# Patient Record
Sex: Male | Born: 1995 | Hispanic: No | Marital: Single | Smoking: Current every day smoker
Health system: Southern US, Community
[De-identification: ages and names within clinical notes are randomized; demographics above are authoritative.]

---
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
## 2013-08-04 ENCOUNTER — Emergency Department (HOSPITAL_COMMUNITY)
Admission: EM | Admit: 2013-08-04 | Discharge: 2013-08-05 | Disposition: A | Discharge status: Home / Self Care | Payer: No Typology Code available for payment source | Attending: Emergency Medicine | Admitting: Emergency Medicine

## 2013-08-04 DIAGNOSIS — Y9389 Activity, other specified: Secondary | ICD-10-CM | POA: Insufficient documentation

## 2013-08-04 DIAGNOSIS — F29 Unspecified psychosis not due to a substance or known physiological condition: Secondary | ICD-10-CM | POA: Insufficient documentation

## 2013-08-04 DIAGNOSIS — Z23 Encounter for immunization: Secondary | ICD-10-CM | POA: Insufficient documentation

## 2013-08-04 DIAGNOSIS — F1010 Alcohol abuse, uncomplicated: Secondary | ICD-10-CM | POA: Insufficient documentation

## 2013-08-04 DIAGNOSIS — F10929 Alcohol use, unspecified with intoxication, unspecified: Secondary | ICD-10-CM

## 2013-08-04 DIAGNOSIS — S0100XA Unspecified open wound of scalp, initial encounter: Secondary | ICD-10-CM | POA: Insufficient documentation

## 2013-08-04 DIAGNOSIS — F17200 Nicotine dependence, unspecified, uncomplicated: Secondary | ICD-10-CM | POA: Insufficient documentation

## 2013-08-04 DIAGNOSIS — S0101XA Laceration without foreign body of scalp, initial encounter: Secondary | ICD-10-CM

## 2013-08-04 DIAGNOSIS — Y92410 Unspecified street and highway as the place of occurrence of the external cause: Secondary | ICD-10-CM | POA: Insufficient documentation

## 2013-08-04 MED ORDER — TETANUS-DIPHTHERIA TOXOIDS TD 5-2 LFU IM INJ
0.5000 mL | INJECTION | Freq: Once | INTRAMUSCULAR | Status: AC
  Administered 2013-08-05: 0.5 mL via INTRAMUSCULAR
  Filled 2013-08-04: qty 0.5
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
## 2013-08-05 ENCOUNTER — Encounter (HOSPITAL_COMMUNITY): Payer: Self-pay | Admitting: Emergency Medicine

## 2013-08-05 ENCOUNTER — Emergency Department (HOSPITAL_COMMUNITY): Payer: No Typology Code available for payment source

## 2013-08-05 LAB — COMPREHENSIVE METABOLIC PANEL
ALT: 34 U/L (ref 0–53)
Alkaline Phosphatase: 78 U/L (ref 39–117)
CO2: 24 mEq/L (ref 19–32)
Calcium: 8.6 mg/dL (ref 8.4–10.5)
GFR calc Af Amer: >90 mL/min (ref >90)
GFR calc non Af Amer: >90 mL/min (ref >90)
Glucose, Bld: 121 mg/dL — ABNORMAL HIGH (ref 70–99)
Sodium: 141 mEq/L (ref 135–145)
Total Bilirubin: 0.4 mg/dL (ref 0.3–1.2)

## 2013-08-05 LAB — POCT I-STAT, CHEM 8
Calcium, Ion: 1.16 mmol/L (ref 1.12–1.23)
Hemoglobin: 16.3 g/dL (ref 13.0–17.0)
Potassium: 3.6 mEq/L (ref 3.5–5.1)
Sodium: 145 mEq/L (ref 135–145)
TCO2: 25 mmol/L (ref 0–100)

## 2013-08-05 LAB — CBC
Hemoglobin: 16.4 g/dL (ref 13.0–17.0)
MCHC: 36.2 g/dL — ABNORMAL HIGH (ref 30.0–36.0)
Platelets: 331 10*3/uL (ref 150–400)
RDW: 12.4 % (ref 11.5–15.5)

## 2013-08-05 LAB — RAPID URINE DRUG SCREEN, HOSP PERFORMED
Benzodiazepines: NOT DETECTED
Opiates: NOT DETECTED
Tetrahydrocannabinol: POSITIVE — AB

## 2013-08-05 LAB — URINE MICROSCOPIC-ADD ON

## 2013-08-05 LAB — URINALYSIS, ROUTINE W REFLEX MICROSCOPIC
Bilirubin Urine: NEGATIVE
Glucose, UA: NEGATIVE mg/dL
Ketones, ur: NEGATIVE mg/dL
pH: 6 (ref 5.0–8.0)

## 2013-08-05 LAB — PROTIME-INR
INR: 0.88 (ref 0.00–1.49)
Prothrombin Time: 11.8 seconds (ref 11.6–15.2)

## 2013-08-05 LAB — CG4 I-STAT (LACTIC ACID): Lactic Acid, Venous: 2.63 mmol/L — ABNORMAL HIGH (ref 0.5–2.2)

## 2013-08-05 MED ORDER — OXYCODONE-ACETAMINOPHEN 5-325 MG PO TABS
1.0000 | ORAL_TABLET | Freq: Once | ORAL | Status: AC
  Administered 2013-08-05: 1 via ORAL
  Filled 2013-08-05: qty 1

## 2013-08-05 MED ORDER — OXYCODONE-ACETAMINOPHEN 5-325 MG PO TABS: 2.0000 | ORAL_TABLET | ORAL | Status: DC | PRN

## 2013-08-05 MED ORDER — IOHEXOL 300 MG/ML  SOLN
100.0000 mL | Freq: Once | INTRAMUSCULAR | Status: AC | PRN
  Administered 2013-08-05: 100 mL via INTRAVENOUS

## 2013-08-05 MED ORDER — SODIUM CHLORIDE 0.9 % IV SOLN
Freq: Once | INTRAVENOUS | Status: AC
  Administered 2013-08-04: via INTRAVENOUS

## 2013-08-05 NOTE — ED Notes (Signed)
Patient removed blood pressure cuff.  No vital available for 1 hour.
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
## 2013-08-05 NOTE — ED Notes (Signed)
Back from CT
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
## 2013-08-05 NOTE — ED Notes (Signed)
Patient and family members are having discussion.  Explained to Father that we did not need to have arguing going on.

## 2013-08-05 NOTE — ED Notes (Signed)
Collar removed per Dr Norlene Campbell
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
## 2013-08-11 ENCOUNTER — Encounter (HOSPITAL_COMMUNITY): Payer: Self-pay | Admitting: Emergency Medicine

## 2013-08-11 ENCOUNTER — Emergency Department (HOSPITAL_COMMUNITY)
Admission: EM | Admit: 2013-08-11 | Discharge: 2013-08-11 | Disposition: A | Discharge status: Home / Self Care | Payer: No Typology Code available for payment source | Attending: Emergency Medicine | Admitting: Emergency Medicine

## 2013-08-11 DIAGNOSIS — Z4802 Encounter for removal of sutures: Secondary | ICD-10-CM

## 2013-08-11 DIAGNOSIS — F17200 Nicotine dependence, unspecified, uncomplicated: Secondary | ICD-10-CM | POA: Insufficient documentation

## 2013-08-11 DIAGNOSIS — R51 Headache: Secondary | ICD-10-CM | POA: Insufficient documentation

## 2013-08-11 NOTE — ED Notes (Signed)
Pt here for staple removal from scalp.
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
## 2013-08-11 NOTE — ED Provider Notes (Signed)
Medical screening examination/treatment/procedure(s) were performed by non-physician practitioner and as supervising physician I was immediately available for consultation/collaboration.  EKG Interpretation   None         Charles B. Bernette MayersSheldon, MD 08/11/13 [phone]

## 2013-08-11 NOTE — Discharge Instructions (Signed)
Please read and follow all provided instructions.  Your diagnoses today include:  1. Encounter for staple removal     Tests performed today include:  Vital signs. See below for your results today.   Medications prescribed:   None  Home care instructions:  Follow any educational materials contained in this packet.  Follow-up instructions: Please follow-up with your primary care provider as needed for further evaluation of your symptoms.  If you do not have a primary care doctor -- see below for referral information.   Return instructions:   Please return to the Emergency Department if you experience worsening symptoms.   Please return if you have any other emergent concerns.  Additional Information:  Your vital signs today were: BP 121/75   Pulse 97   Temp(Src) 97.5 F (36.4 C) (Oral)   Resp 20   SpO2 98% If your blood pressure (BP) was elevated above 135/85 this visit, please have this repeated by your doctor within one month. ---------------  Emergency Department Resource Guide 1) Find a Doctor and Pay Out of Pocket Although you won't have to find out who is covered by your insurance plan, it is a good idea to ask around and get recommendations. You will then need to call the office and see if the doctor you have chosen will accept you as a new patient and what types of options they offer for patients who are self-pay. Some doctors offer discounts or will set up payment plans for their patients who do not have insurance, but you will need to ask so you aren't surprised when you get to your appointment.  2) Contact Your Local Health Department Not all health departments have doctors that can see patients for sick visits, but many do, so it is worth a call to see if yours does. If you don't know where your local health department is, you can check in your phone book. The CDC also has a tool to help you locate your state's health department, and many state websites also have  listings of all of their local health departments.  3) Find a Walk-in Clinic If your illness is not likely to be very severe or complicated, you may want to try a walk in clinic. These are popping up all over the country in pharmacies, drugstores, and shopping centers. They're usually staffed by nurse practitioners or physician assistants that have been trained to treat common illnesses and complaints. They're usually fairly quick and inexpensive. However, if you have serious medical issues or chronic medical problems, these are probably not your best option.  No Primary Care Doctor: - Call Health Connect at  [phone] - they can help you locate a primary care doctor that  accepts your insurance, provides certain services, etc. - Physician Referral Service- [phone]  Chronic Pain Problems: Organization         Address  Phone   Notes  [facility]  [phone] Patients need to be referred by their primary care doctor.   Medication Assistance: Organization         Address  Phone   Notes  [facility] Medication [facility] [address] [place], [place] [address] [phone] --Must be a resident of [facility] -- Must have NO insurance coverage whatsoever (no Medicaid/ Medicare, etc.) -- The pt. MUST have a primary care doctor that directs their care regularly and follows them in the community   [facility]  [phone]   [address]  (  [phone]    Agencies that provide inexpensive medical care: Organization         Address  Phone   Notes  [facility]  [phone]   [facility]    [phone]   [facility] [address] [place], [place] [address] [phone]   [facility] [address], [place] [phone]   Planned Parenthood    [phone]   [facility]    [phone]   [facility]  [address], [place] Phone:  [phone], Fax:  [phone] Hours of Operation:  9 am - 6 pm, M-F.  Also accepts Medicaid/Medicare and self-pay.  [facility]  [address], [place] Phone: [phone], Fax: [phone]. Hours of Operation:  8:30 am - 5:30 pm, M-F.  Also accepts Medicaid and self-pay.  [facility] [place] [address] [place] Phone: [phone]   [facility] [address] Natasha Bence [place], [place] [phone], Ext. [phone] Mondays & Thursdays: 7-9 AM.  First 15 patients are seen on a first come, first serve basis.    Medicaid-accepting [facility] Providers:  Organization         Address  Phone   Notes  [facility] [address], [place] [phone] Also accepts self-pay patients.  [facility] [address] Laurell Josephs [place], [place]  [phone]   [facility] [address], [place] [phone]   [facility] [address] [place] [phone]   Renaye Rakers [address], [place]   [phone] Only accepts [place] Access [place] patients after they have their name applied to their card.   Self-Pay (no insurance) in [facility]:  Organization         Address  Phone   Notes  Sickle Cell Patients, [facility] [address] [place], [place] [phone]   [facility] [address] [place], [place] [phone]   [facility] [place]  [address], [place] [phone]   [facility]/Dr. Osei-Bonsu  [address] [place] or [address], [place] [phone] Phone number for both [place] and [place] locations is the same.  [facility] [address], [place] [phone]   [facility] [address],  [place] or [address] [phone]   [facility] [address], [place] [phone], phone; [phone], fax Sees patients 1st and 3rd Saturday of every month.  Must not qualify for public or private insurance (i.e. Medicaid, Medicare, [place] Health Choice, Veterans' Benefits)  Household income should be no more than 200% of the poverty level The clinic cannot treat you if you are pregnant or think you are pregnant  Sexually transmitted diseases are not treated at the clinic.    Dental Care: Organization         Address  Phone  Notes  [facility] [address] [place], [place] [phone] Accepts children up to age 79 who are enrolled in [place] or [place] Health Choice; pregnant women with a Medicaid card; and children who  have applied for Medicaid or [place] Health Choice, but were declined, whose parents can pay a reduced fee at time of service.  [facility] [place]  [address], [place] [phone] Accepts children up to age 521 who are enrolled in [place] or [place] Health Choice; pregnant women with a Medicaid card; and children who have applied for Medicaid or [place] Health Choice, but were declined, whose parents can pay a reduced fee at time of service.  [facility]  [address], [place] [phone] Patients are seen by appointment only. Walk-ins are not accepted. [facility] will see patients 20 years of age and older. Monday - Tuesday (8am-5pm) Most Wednesdays (8:30-5pm) $30 per visit, cash only  [facility]  [address], [facility] [phone] Patients are seen by appointment only. Walk-ins are not accepted. [facility] will see patients 20 years of age and older. One Wednesday Evening (Monthly: Volunteer Based).  $30 per visit, cash only  [employer] [facility] [facility]  [phone] for adults; Children under age 544, call Graduate Pediatric Dentistry at [phone]. Children aged 374-14, please call [phone] to request a pediatric application.  Dental services are provided in all areas of dental care including fillings, crowns and bridges, complete and partial dentures, implants, gum treatment, root canals, and extractions. Preventive care is also provided. Treatment is provided to both adults and children. Patients are selected via a lottery and there is often a waiting list.   [facility] [address], [place]  [phone] [email or web address]   [facility] [address], [place], [place] [phone], Ext. [phone] Second and Fourth Thursday of each month, opens at 6:30 AM; Clinic ends at 9 AM.  Patients are seen on a first-come first-served basis, and a limited number are seen during each clinic.   [facility]  [address], [place], [place] [phone]   Eligibility Requirements You must have lived in [place], [place], or [place] counties for at least the last three months.   You cannot be eligible for state or federal sponsored [place]healthcare insurance, including CIGNAVeterans Administration, [place]icaid, or [employer]icare.   You generally cannot be eligible for healthcare insurance through your employer.    How to apply: Eligibility screenings are held every Tuesday and Wednesday afternoon from 1:00 pm until 4:00 pm. You do not need an appointment for the interview!  [facility] [address], [place], [place] [phone]   [facility]  [phone]   [facility]  [phone]   [facility]  [phone]    Behavioral Health Resources in the Community: Intensive Outpatient Programs Organization         Address  Phone  Notes  [facility] [address], [place], [place] [phone]     [facility] [address], [place], [place] [phone]   [facility] [address], [place], [place]  [phone]   [facility] [address],  [place], [place] [phone] or [phone]   Substance Abuse Resources Organization         Address  Phone  Notes  [facility]  [phone]   [facility]  [phone]   [facility]  [phone]   [facility]  [phone]   Residential &  Outpatient Substance Abuse Program  [phone]   Psychological Services Organization         Address  Phone  Notes  [facility] Behavioral Health  [phone]   [facility]  [phone]   [facility] [phone] [address] [place] [phone] or [phone]    Mobile Crisis Teams Organization         Address  Phone  Notes  [facility], Mobile Crisis Care Unit  [phone]   [facility]  [address] [place], [place] [phone]   Doristine Locks [address] [place] [phone]    Self-Help/Support Groups Organization         Address  Phone             Notes  [facility] [place] - variety of support groups  [phone]- I7437963 Call for more information  Narcotics Anonymous (NA), [facility] [address], [employer] [place]  2 meetings at this location   Statistician         Address  Phone  Notes  [facility] [address],    [place]  [phone]   [facility]  [address], [place] [address], [place], [place] [phone]   [facility] [address] [place], [place] [phone] Admissions: 8am-3pm M-F  [facility] [address],    [place], [place] [phone]   [facility] [address] [place], [place], [place] [phone]   [facility] [address]  [place],  [place] [phone]   [facility] - Intensive Outpatient [address]ure[address], [place], [place] [phone]   [facility] ([facility] [address] [place]  [place], [place] [phone] or [phone]   [facility] ([facility]) [address] [place], [place] [phone] Accepts Medicaid  [facility] [address],  [place] [phone] Substance Abuse/Addiction Treatment   [facility] Organization         Address  Phone  Notes  [facility]  [phone]   Angie Fava, PhD [address] Ervin Knack [place], [place]   [phone] or [phone]   [facility]   [address] [place], [place] [phone]   [facility] [address] [place], [place] [phone] Insurance/Medicaid/sponsorship through [facility] and Families [address]                                    [place], [place] [phone] Therapy/tele-psych/case  [facility] [address], [place] [phone]    Dr. Lolly Mustache  [phone]   [facility] [place]  [employer] [facility] Dept. 1) [address] [place] 2) [address] [place] 3)  [address], [place] [phone]  [phone]   [facility] Child Abuse Hotline [phone])[phone] or [phone] (After Hours)

## 2013-10-13 ENCOUNTER — Encounter (HOSPITAL_COMMUNITY): Payer: Self-pay | Admitting: Emergency Medicine

## 2013-10-13 ENCOUNTER — Emergency Department (HOSPITAL_COMMUNITY)
Admission: EM | Admit: 2013-10-13 | Discharge: 2013-10-13 | Disposition: A | Discharge status: Home / Self Care | Payer: No Typology Code available for payment source | Attending: Emergency Medicine | Admitting: Emergency Medicine

## 2013-10-13 DIAGNOSIS — L03119 Cellulitis of unspecified part of limb: Principal | ICD-10-CM

## 2013-10-13 DIAGNOSIS — L0291 Cutaneous abscess, unspecified: Secondary | ICD-10-CM

## 2013-10-13 DIAGNOSIS — F17200 Nicotine dependence, unspecified, uncomplicated: Secondary | ICD-10-CM | POA: Insufficient documentation

## 2013-10-13 DIAGNOSIS — L02419 Cutaneous abscess of limb, unspecified: Secondary | ICD-10-CM | POA: Insufficient documentation

## 2013-10-13 DIAGNOSIS — L0390 Cellulitis, unspecified: Secondary | ICD-10-CM

## 2013-10-13 MED ORDER — CLINDAMYCIN HCL 150 MG PO CAPS: 300.0000 mg | ORAL_CAPSULE | Freq: Three times a day (TID) | ORAL | Status: AC

## 2013-10-13 MED ORDER — TRAMADOL HCL 50 MG PO TABS: 50.0000 mg | ORAL_TABLET | Freq: Four times a day (QID) | ORAL | Status: AC | PRN

## 2013-10-13 NOTE — ED Notes (Signed)
Per pt 4 days of red area to LLE that he believes is a spider bite. Hx of same on arm.

## 2013-10-13 NOTE — ED Provider Notes (Signed)
CSN: [id number]     Arrival date & time 10/13/13  1644 History  This chart was scribed for non-physician practitioner Emilia BeckKaitlyn Namira Rosekrans, PA-C working with Layla MawKristen N Ward, DO by Joaquin MusicKristina Sanchez-Matthews, ED Scribe. This patient was seen in room TR07C/TR07C and the patient's care was started at 6:05 PM .   Chief Complaint  Patient presents with  . Abscess   The history is provided by the patient. No language interpreter was used.   HPI Comments: Nonie Hoyeredro Tilmon is a 18 y.o. male who presents to the Emergency Department complaining of abscess to LLE that presented 4 days ago. Pt states he is unsure if he was bit by an animal. He states he has a hx of abscesses to his RUE. Pt denies any recent fevers.   History reviewed. No pertinent past medical history. History reviewed. No pertinent past surgical history. History reviewed. No pertinent family history. History  Substance Use Topics  . Smoking status: Current Every Day Smoker  . Smokeless tobacco: Not on file  . Alcohol Use: Yes    Review of Systems  All other systems reviewed and are negative.   Allergies  Shrimp  Home Medications  No current outpatient prescriptions on file.  BP 138/79  Pulse 91  Temp(Src) 98.5 F (36.9 C) (Oral)  Resp 18  SpO2 98%  Physical Exam  Nursing note and vitals reviewed. Constitutional: He is oriented to person, place, and time. He appears well-developed and well-nourished. No distress.  HENT:  Head: Normocephalic and atraumatic.  Eyes: EOM are normal.  Neck: Neck supple. No tracheal deviation present.  Cardiovascular: Normal rate.   Pulmonary/Chest: Effort normal. No respiratory distress.  Musculoskeletal: Normal range of motion.  Neurological: He is alert and oriented to person, place, and time.  Skin: Skin is warm and dry.  3 by 3 cm area of erythema and tenderness to LLE.   Psychiatric: He has a normal mood and affect. His behavior is normal.    ED Course  Procedures  DIAGNOSTIC  STUDIES: Oxygen Saturation is 98% on RA, normal by my interpretation.    COORDINATION OF CARE: 6:05 PM-Discussed treatment plan which includes I&D. Per patients request, will culture drainage from abscess. Pt agreed to plan.   6:10 PM- INCISION AND DRAINAGE Performed by: Emilia BeckKaitlyn Izayah Miner, PA-C Consent: Verbal consent obtained. Risks and benefits: risks, benefits and alternatives were discussed  Sterile Prep and Drape  Type: abscess  Body area: LLE  Anesthesia: none  Local anesthetic: none  Anesthetic total: 0 ml  Incision: 11 Blade  Complexity: complex Blunt dissection to breakup loculations  Drainage: 2 cc of purulent discharge  Drainage amount: minimal    Flushed with copious amount of sterile saline  Patient tolerance: Patient tolerated the procedure well with no immediate complications.  6:12 PM- Will discharge pt with antibiotics and pain medication. Informed pt abscess will continue to drain. Pt agreed to plan.  Labs Review Labs Reviewed - No data to display Imaging Review No results found.   EKG Interpretation None      MDM   Final diagnoses:  Abscess and cellulitis    6:40 PM Patient's abscess drained without complication. Patient will have Clindamycin and tramadol. Vitals stable and patient afebrile. Patient advised to return with worsening or concerning symptoms.    Emilia BeckKaitlyn Aseneth Hack, PA-C 10/13/13 1840

## 2013-10-13 NOTE — ED Provider Notes (Signed)
Medical screening examination/treatment/procedure(s) were performed by non-physician practitioner and as supervising physician I was immediately available for consultation/collaboration.   EKG Interpretation None        Zettie Gootee N Dorean Hiebert, DO 10/13/13 2350 

## 2013-10-13 NOTE — Discharge Instructions (Signed)
Take Clindamycin as directed until gone. Take Tramadol as needed for pain. Refer to attached documents for more information. Return to the ED with worsening or concerning symptoms.

## 2013-10-13 NOTE — ED Notes (Signed)
Pt discharged.Vital signs stable and GCS 15 

## 2013-10-16 ENCOUNTER — Telehealth (HOSPITAL_COMMUNITY): Payer: Self-pay

## 2013-10-16 ENCOUNTER — Telehealth (HOSPITAL_BASED_OUTPATIENT_CLINIC_OR_DEPARTMENT_OTHER): Payer: Self-pay | Admitting: Emergency Medicine

## 2013-10-16 LAB — WOUND CULTURE: Special Requests: NORMAL

## 2013-10-16 NOTE — ED Notes (Signed)
Pt returned call. Pt informed of Dx, tx rcvd approp. and provided MRSA education.

## 2013-10-16 NOTE — Telephone Encounter (Signed)
lab called + wound culture + MRSA. Treated with Clindamycin, sensitive to same. Left message @ # provided in epic for patient to call flow managers' #.
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
